# Patient Record
Sex: Female | Born: 1995 | Race: White | Hispanic: No | Marital: Single | Smoking: Never smoker
Health system: Southern US, Community
[De-identification: ages and names within clinical notes are randomized; demographics above are authoritative.]

---

## 2017-02-09 ENCOUNTER — Emergency Department (HOSPITAL_COMMUNITY): Payer: 59

## 2017-02-09 ENCOUNTER — Encounter (HOSPITAL_COMMUNITY): Payer: Self-pay | Admitting: *Deleted

## 2017-02-09 ENCOUNTER — Emergency Department (HOSPITAL_COMMUNITY)
Admission: EM | Admit: 2017-02-09 | Discharge: 2017-02-09 | Disposition: A | Discharge status: Home / Self Care | Payer: 59 | Attending: Emergency Medicine | Admitting: Emergency Medicine

## 2017-02-09 DIAGNOSIS — Z79899 Other long term (current) drug therapy: Secondary | ICD-10-CM | POA: Diagnosis not present

## 2017-02-09 DIAGNOSIS — R0789 Other chest pain: Secondary | ICD-10-CM | POA: Insufficient documentation

## 2017-02-09 DIAGNOSIS — R000 Tachycardia, unspecified: Secondary | ICD-10-CM | POA: Diagnosis not present

## 2017-02-09 DIAGNOSIS — R079 Chest pain, unspecified: Secondary | ICD-10-CM

## 2017-02-09 DIAGNOSIS — R0902 Hypoxemia: Secondary | ICD-10-CM | POA: Diagnosis not present

## 2017-02-09 LAB — MAGNESIUM: MAGNESIUM: 1.5 mg/dL — AB (ref 1.7–2.4)

## 2017-02-09 LAB — BASIC METABOLIC PANEL
Anion gap: 9 (ref 5–15)
BUN: 9 mg/dL (ref 6–20)
CALCIUM: 10.1 mg/dL (ref 8.9–10.3)
CO2: 23 mmol/L (ref 22–32)
CREATININE: 0.83 mg/dL (ref 0.44–1.00)
Chloride: 105 mmol/L (ref 101–111)
GFR calc non Af Amer: >60 mL/min (ref >60)
GLUCOSE: 108 mg/dL — AB (ref 65–99)
Potassium: 3.8 mmol/L (ref 3.5–5.1)
Sodium: 137 mmol/L (ref 135–145)

## 2017-02-09 LAB — CBC
HEMATOCRIT: 42.1 % (ref 36.0–46.0)
Hemoglobin: 14.3 g/dL (ref 12.0–15.0)
MCH: 30.4 pg (ref 26.0–34.0)
MCHC: 34 g/dL (ref 30.0–36.0)
MCV: 89.4 fL (ref 78.0–100.0)
PLATELETS: 325 10*3/uL (ref 150–400)
RBC: 4.71 MIL/uL (ref 3.87–5.11)
RDW: 13.1 % (ref 11.5–15.5)
WBC: 7 10*3/uL (ref 4.0–10.5)

## 2017-02-09 LAB — CK TOTAL AND CKMB (NOT AT ARMC)
CK TOTAL: 70 U/L (ref 38–234)
CK, MB: 0.7 ng/mL (ref 0.5–5.0)
Relative Index: INVALID (ref 0.0–2.5)

## 2017-02-09 LAB — I-STAT BETA HCG BLOOD, ED (MC, WL, AP ONLY): I-stat hCG, quantitative: <5 m[IU]/mL (ref <5)

## 2017-02-09 LAB — TSH: TSH: 1.486 u[IU]/mL (ref 0.350–4.500)

## 2017-02-09 LAB — I-STAT TROPONIN, ED
Troponin i, poc: 0 ng/mL (ref 0.00–0.08)
Troponin i, poc: 0 ng/mL (ref 0.00–0.08)

## 2017-02-09 MED ORDER — METOPROLOL TARTRATE 5 MG/5ML IV SOLN
10.0000 mg | Freq: Once | INTRAVENOUS | Status: AC
  Administered 2017-02-09: 10 mg via INTRAVENOUS
  Filled 2017-02-09: qty 10

## 2017-02-09 MED ORDER — SODIUM CHLORIDE 0.9 % IV BOLUS (SEPSIS)
1000.0000 mL | Freq: Once | INTRAVENOUS | Status: AC
  Administered 2017-02-09: 1000 mL via INTRAVENOUS

## 2017-02-09 MED ORDER — LORAZEPAM 2 MG/ML IJ SOLN
1.0000 mg | Freq: Once | INTRAMUSCULAR | Status: AC
  Administered 2017-02-09: 1 mg via INTRAVENOUS
  Filled 2017-02-09: qty 1

## 2017-02-09 MED ORDER — IOPAMIDOL (ISOVUE-300) INJECTION 61%
INTRAVENOUS | Status: AC
  Filled 2017-02-09: qty 75

## 2017-02-09 MED ORDER — IOPAMIDOL (ISOVUE-370) INJECTION 76%
INTRAVENOUS | Status: AC
  Administered 2017-02-09: 100 mL via INTRAVENOUS
  Filled 2017-02-09: qty 100

## 2017-02-09 NOTE — ED Provider Notes (Signed)
[facility]-EMERGENCY DEPT Provider Note   CSN: [id number] Arrival date & time: 02/09/17  1008     History   Chief Complaint Chief Complaint  Patient presents with  . Chest Pain    HPI Joann Moore is a 21 y.o. female.  Patient presents by ems from urgent care where she was noted to be tachycardic and hypoxic into the high 80s.  She presents after experiencing gradual onset of left sided chest pain.  Her chest pain is currently a 6/10, described as cramping in the front, and radiates down her left arm and into her back where it feels sharp.  Her chest pain started last night started after she ate dinner, she attempted Tums for relief which did not change her pain.  She reports feeling like her heart is "doing something funny" and missing a beat occasionally.  She reports associated shortness of breath, no nausea, vomiting, abdominal pain. .  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  She reports that over the past month she was taking her "old" birth control as she "accidentally grabbed the wrong package."   Patient denies drug use.  Takes spironolactone for acne and birth control. She has recently had a cold, with congestion, productive cough, cough and post nasal drainage.  She reports that her symptoms are improving and she feels she is almost better.        History reviewed. No pertinent past medical history.  There are no active problems to display for this patient.   History reviewed. No pertinent surgical history.  OB History    No data available       Home Medications    Prior to Admission medications   Medication Sig Start Date End Date Taking? Authorizing Provider  acetaminophen (TYLENOL) 325 MG tablet Take 650 mg by mouth every 6 (six) hours as needed for mild pain.   Yes [provider]  ibuprofen (ADVIL,MOTRIN) 200 MG  tablet Take 200 mg by mouth every 6 (six) hours as needed for moderate pain.   Yes [provider]  Multiple Vitamin (MULTIVITAMIN) tablet Take 1 tablet by mouth daily.   Yes [provider]  norethindrone-ethinyl estradiol-iron (MICROGESTIN FE,GILDESS FE,LOESTRIN FE) 1.5-30 MG-MCG tablet Take 1 tablet by mouth daily.   Yes [provider]  RESTASIS MULTIDOSE 0.05 % ophthalmic emulsion Place 1 drop into both eyes 2 (two) times daily. 02/08/17  Yes [provider]    Family History History reviewed. No pertinent family history.  Social History Social History  Substance Use Topics  . Smoking status: Never Smoker  . Smokeless tobacco: Never Used  . Alcohol use Yes     Allergies   Hydrocodone   Review of Systems Review of Systems  HENT: Positive for congestion, postnasal drip and rhinorrhea. Negative for sinus pain and sinus pressure.   Eyes: Negative for visual disturbance.  Respiratory: Positive for chest tightness and shortness of breath.   Cardiovascular: Positive for chest pain and palpitations. Negative for leg swelling.  Gastrointestinal: Negative for abdominal pain, diarrhea, nausea and vomiting.  Genitourinary: Negative for decreased urine volume, flank pain and urgency.  Musculoskeletal: Positive for back pain. Negative for arthralgias, joint swelling and neck stiffness.  Skin: Negative for color change and rash.  Neurological: Negative for weakness, light-headedness and numbness.  Psychiatric/Behavioral: The patient is nervous/anxious.  Physical Exam Updated Vital Signs BP 115/70   Pulse 76   Temp 98.1 F (36.7 C) (Oral)   Resp 20   LMP 02/02/2017 (Approximate)   SpO2 100%   Physical Exam  Constitutional: She appears well-developed and well-nourished.  HENT:  Head: Normocephalic and atraumatic.  Nose: Nose normal.  Mouth/Throat: No oropharyngeal exudate.  Eyes: Conjunctivae are normal.  Neck: Normal range of motion.  Neck supple. No JVD present. No tracheal deviation present.  Cardiovascular: S1 normal, S2 normal, normal heart sounds and intact distal pulses.  An irregular rhythm present. Tachycardia present.  Exam reveals no friction rub.   No murmur heard. Pulmonary/Chest: Effort normal and breath sounds normal. No accessory muscle usage or stridor. No respiratory distress. She has no wheezes.  Abdominal: Soft. Bowel sounds are normal. She exhibits no distension. There is no tenderness.  Musculoskeletal: She exhibits no edema.  Neurological: She is alert. No cranial nerve deficit. She exhibits normal muscle tone.  Skin: Skin is warm, dry and intact. No rash noted. She is not diaphoretic. No cyanosis. Nails show no clubbing.  Psychiatric: Her speech is normal and behavior is normal. Judgment and thought content normal. Her mood appears anxious. Cognition and memory are normal.  Nursing note and vitals reviewed.    ED Treatments / Results  Labs (all labs ordered are listed, but only abnormal results are displayed) Labs Reviewed  BASIC METABOLIC PANEL - Abnormal; Notable for the following:       Result Value   Glucose, Bld 108 (*)    All other components within normal limits  MAGNESIUM - Abnormal; Notable for the following:    Magnesium 1.5 (*)    All other components within normal limits  CBC  TSH  CK TOTAL AND CKMB (NOT AT [facility])  I-STAT BETA HCG BLOOD, ED ([facility], [facility], [facility] ONLY)  I-STAT TROPOININ, ED  I-STAT TROPOININ, ED    EKG  EKG Interpretation  Date/Time:  Thursday Feb 09 2017 10:09:30 EDT Ventricular Rate:  117 PR Interval:    QRS Duration: 83 QT Interval:  318 QTC Calculation: 444 R Axis:   75 Text Interpretation:  Sinus tachycardia No previous ECGs available Confirmed by YAO  MD, DAVID ([id number]) on 02/09/2017 10:21:10 AM       Radiology Dg Chest 2 View  Result Date: 02/09/2017 CLINICAL DATA:  Chest and lt arm pain started 930 last night till now,,pain goes to upper lt shoulder  area as well EXAM: CHEST - 2 VIEW COMPARISON:  none FINDINGS: Lungs are clear. Heart size and mediastinal contours are within normal limits. No effusion.  No pneumothorax. Visualized bones unremarkable. IMPRESSION: No acute cardiopulmonary disease. Electronically Signed   By: Corlis Leak M.D.   On: 02/09/2017 10:51   Ct Angio Chest Pe W Or Wo Contrast  Result Date: 02/09/2017 CLINICAL DATA:  Sternal chest pain radiating into back last night. EXAM: CT ANGIOGRAPHY CHEST WITH CONTRAST TECHNIQUE: Multidetector CT imaging of the chest was performed using the standard protocol during bolus administration of intravenous contrast. Multiplanar CT image reconstructions and MIPs were obtained to evaluate the vascular anatomy. CONTRAST:  100 cc Isovue 370 IV COMPARISON:  Chest x-ray earlier today FINDINGS: Cardiovascular: No filling defects in the pulmonary arteries to suggest pulmonary emboli. Insert Heart is Mediastinum/Nodes: No mediastinal, hilar, or axillary adenopathy. Lungs/Pleura: Lungs are clear. No focal airspace opacities or suspicious nodules. No effusions. Upper Abdomen: Imaging into the upper abdomen shows no acute findings. Musculoskeletal: No acute bony abnormality. Review  of the MIP images confirms the above findings. IMPRESSION: No evidence of pulmonary embolus. No acute cardiopulmonary disease. Electronically Signed   By: Charlett Nose M.D.   On: 02/09/2017 12:33    Procedures Procedures (including critical care time)  Medications Ordered in ED Medications  sodium chloride 0.9 % bolus 1,000 mL (0 mLs Intravenous Stopped 02/09/17 1349)  iopamidol (ISOVUE-370) 76 % injection (100 mLs Intravenous Contrast Given 02/09/17 1153)  LORazepam (ATIVAN) injection 1 mg (1 mg Intravenous Given 02/09/17 1138)  sodium chloride 0.9 % bolus 1,000 mL (0 mLs Intravenous Stopped 02/09/17 1537)  LORazepam (ATIVAN) injection 1 mg (1 mg Intravenous Given 02/09/17 1323)  metoprolol (LOPRESSOR) injection 10 mg (10 mg  Intravenous Given 02/09/17 1323)     Initial Impression / Assessment and Plan / ED Course  I have reviewed the triage vital signs and the nursing notes.  Pertinent labs & imaging results that were available during my care of the patient were reviewed by me and considered in my medical decision making (see chart for details).  Clinical Course as of Feb 10 1644  Thu Feb 09, 2017  1047 Informed by RN that patient is down to 88 on room air after coming back from x-ray.   [EH]  1100 Informed patient of results thus far and plan for CT chest.  No needs at this time.   [EH]  1212 Checked in on patient.  She is tearful but reports feeling less anxious.  HR in 130s.  Awaiting CTA read  [EH]  1258 Informed patient of CT results.  Patient reports recent stressors in her life including moving to [place], starting a new job on Monday, moving in with her boy friend and graduating college last weekend.   [EH]  1349 Re-checked patient, appears comfortable, napping in the dark.  States pain is down to 4/10. No shortness of breath.   [EH]    Clinical Course User Index [EH] Cristina Gong, PA-C     Due to the patients acuity, tachycardia and hypoxia Dr. Silverio Lay was involved early in the patients care.  Donnamarie Poag presented from the urgent care with left sided chest pain that radiates into her arm. She received an extensive work up due to tachycardia and concerns of being hypoxic.  She is not pregnant, PE was considered due to hormonal birth control usage, hypoxia, and tachycardia, however a CTA was negative for PE.  Chest x-ray was normal.  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  Her initial and repeat troponin were both normal.   She improved in the ED and her heart rate decreased to normal after ativan, fluids, and lopressor IV.  Her heart rate remained normal and she ambulated in the hall with out hypoxia.    Patient is to be  discharged with recommendation to follow up with PCP in regards to today's hospital visit.  CTA negative for PE, VSS at time of discharge, no tracheal deviation, no JVD or new murmur, RRR at discharge, breath sounds equal bilaterally, EKG without acute abnormalities, negative troponin x2, and negative CXR. Pt has been advised to return to the ED if CP becomes exertional, associated with diaphoresis or nausea, radiates to left jaw/arm, worsens or becomes concerning in any way. Pt appears reliable for follow up and is agreeable to discharge.   Case has been discussed with and seen by Dr. Silverio Lay who agrees with the above plan to discharge.  Final Clinical Impressions(s) / ED Diagnoses   Final diagnoses:  Tachycardia  Hypoxia  Chest pain, unspecified type    New Prescriptions New Prescriptions   No medications on file     Norman ClayHammond, Tedrick [place], PA-C 02/09/17 1655    Charlynne PanderYao, David Hsienta, MD 02/11/17 1046

## 2017-02-09 NOTE — ED Notes (Signed)
Pt ambulates to RR with no assistance. Pt O2 sat 96% on RA upon returning and HR 140. Pt is SOB and states she is in more pain.

## 2017-02-09 NOTE — ED Notes (Signed)
Pt is in stable condition upon d/c and ambulates from ED. 

## 2017-02-09 NOTE — ED Notes (Signed)
Pt ambulated throughout hallway with standby assist; denied weakness, dizziness, lightheadedness.   Highest O2 sat: 100% Lowest O2 sat: 92%  HR range: 112-120bpm

## 2017-02-09 NOTE — ED Triage Notes (Signed)
Pt arrives from [facility]. Pt states she was at dinner last night when she had a sudden intense pain to the left chest with radiation to the back and into the left arm. Pt is tachycardic and hypertensive upon arrival and also was hypoxic upon EMS arrival, now 100% on 4L [place].

## 2017-02-09 NOTE — Discharge Instructions (Signed)
Today you received a work up for chest pain and high heart rate (tachycardia).  Your heart enzymes were normal, as were your electrolytes, and kidney function.  You are not pregnant.  You had a chest x-ray which was unremarkable and a CTA chest to check for a pulmonary embolism (blood clot in your lung) which was a normal scan.    Please hydrate, eat a healthy diet and use caution and go slow when going from sitting to standing.  Please follow up with a primary care provider and do not hesitate to seek additional medical care if you have any concerns or your symptoms do not improve.

## 2017-02-09 NOTE — ED Notes (Signed)
Got patient undress on the monitor did ekg shown to Dr Yao patient is resting with family at bedside 

## 2018-11-30 IMAGING — CT CT ANGIO CHEST
3 of 7 series · 18 of 36 positions shown · IV contrast (isovue)
Comparison: Chest x-ray earlier today

CLINICAL DATA: Sternal chest pain radiating into back last night.

EXAM:
CT ANGIOGRAPHY CHEST WITH CONTRAST
TECHNIQUE: Multidetector CT imaging of the chest was performed using the
standard protocol during bolus administration of intravenous
contrast. Multiplanar CT image reconstructions and MIPs were
obtained to evaluate the vascular anatomy.
CONTRAST:  100 cc Isovue 370 IV

[Series 7: pe thins · axial · 0.65mm/px · z∈[+1095,+1315]mm · 12 of 261 slices shown]
[im 21/261  lung]
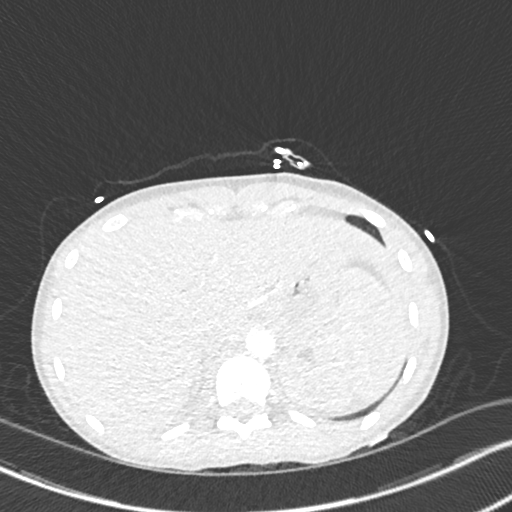
[im 41/261  mediastinal]
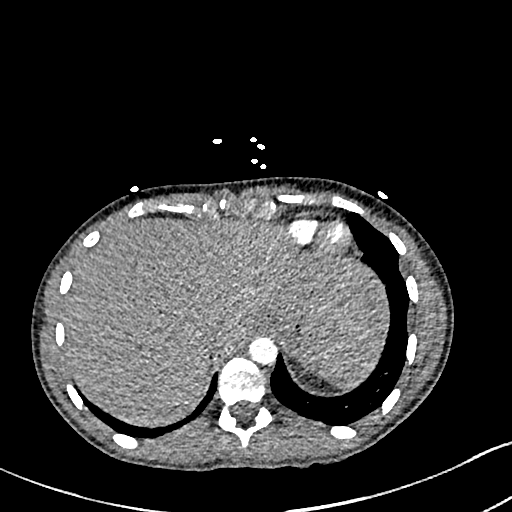
[im 61/261  lung]
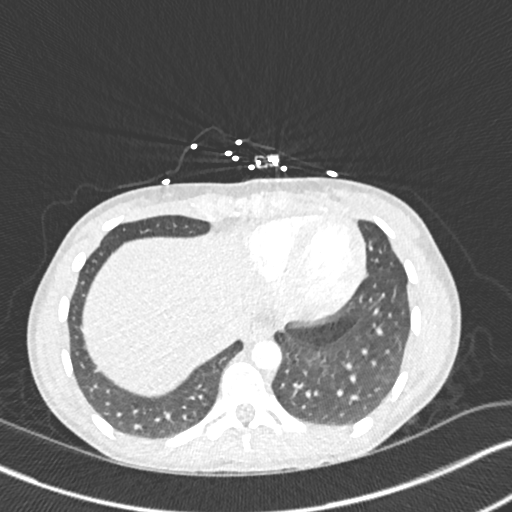
[im 81/261  mediastinal]
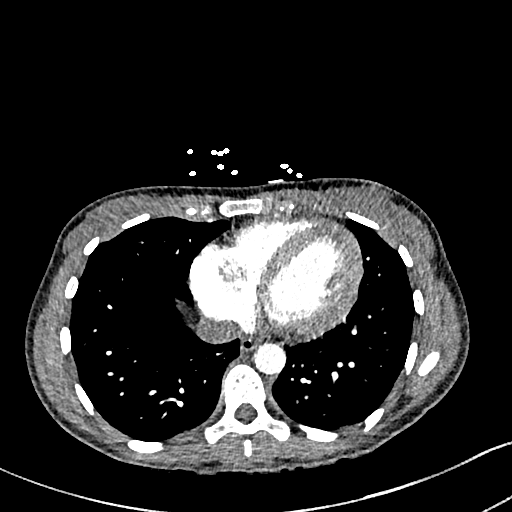
[im 101/261  lung]
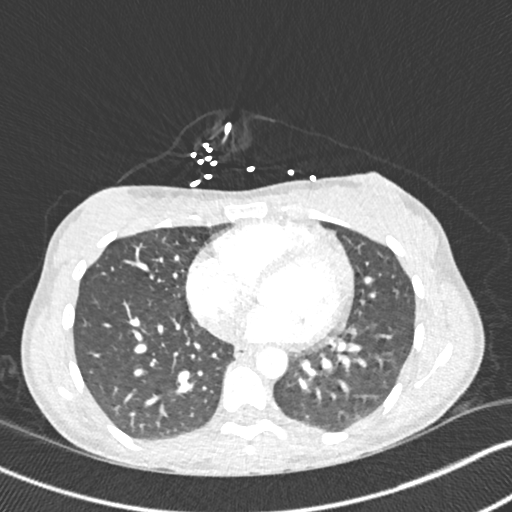
[im 121/261  mediastinal]
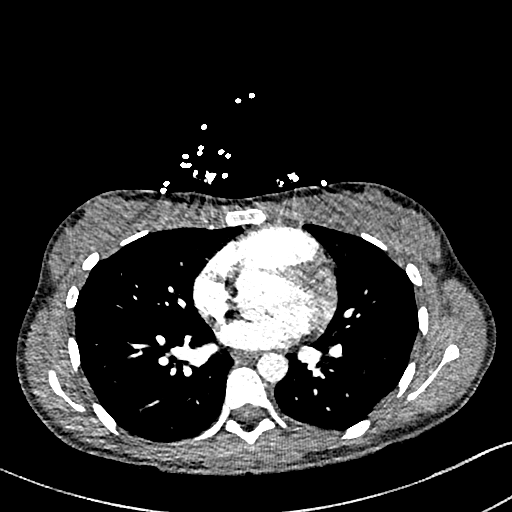
[im 141/261  lung]
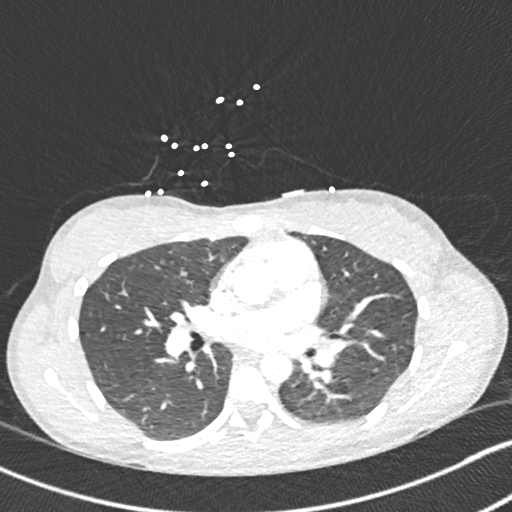
[im 161/261  mediastinal]
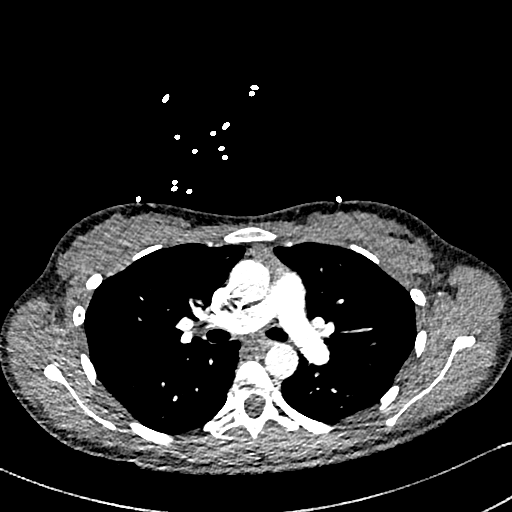
[im 181/261  lung]
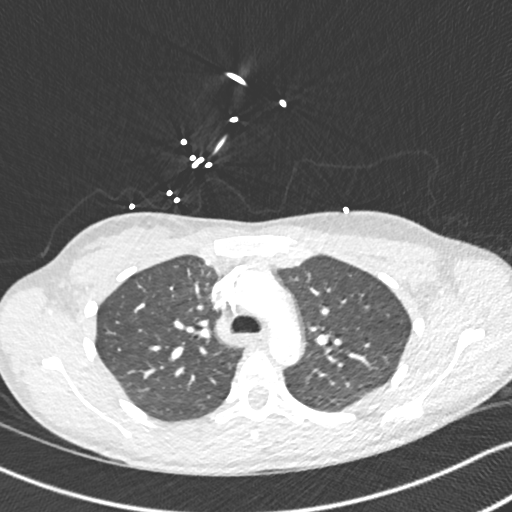
[im 201/261  mediastinal]
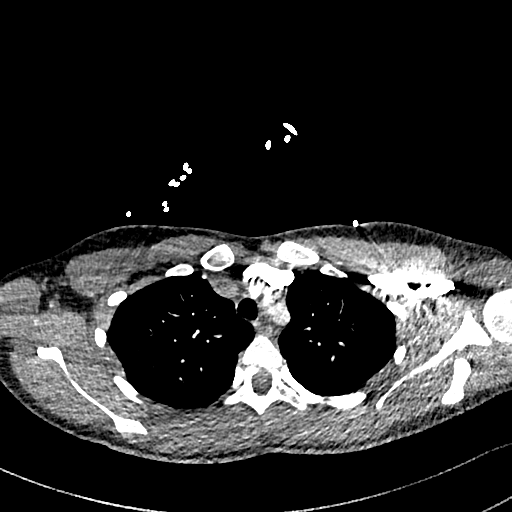
[im 221/261  lung]
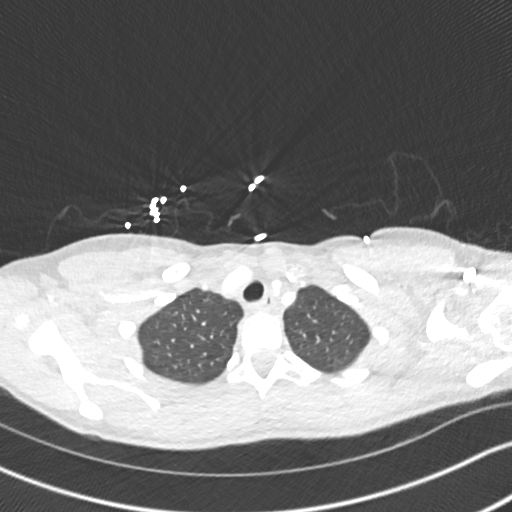
[im 241/261  mediastinal]
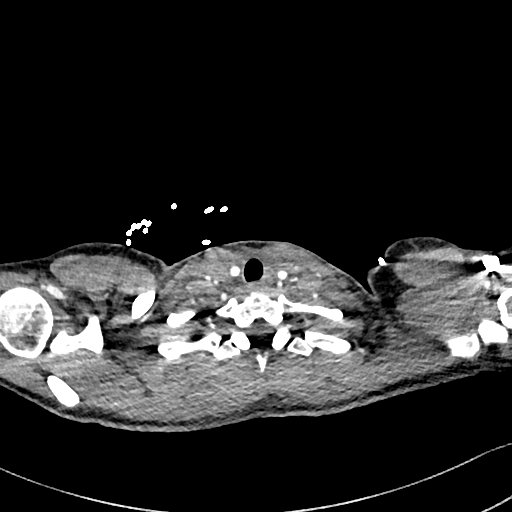

[Series 8: pe lung · axial · 0.61mm/px · z∈[+1123,+1291]mm · 5 of 128 slices shown]
[im 22/128  mediastinal]
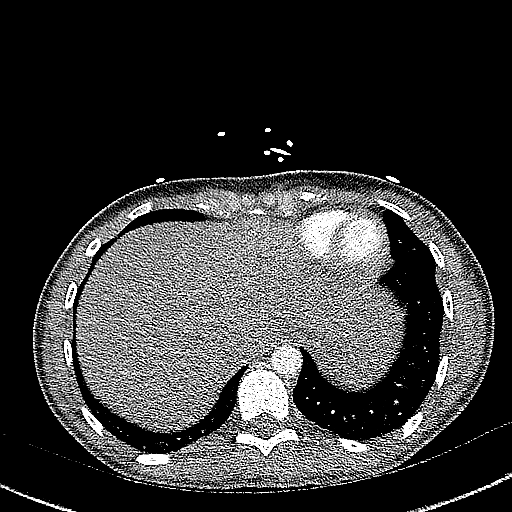
[im 43/128  mediastinal]
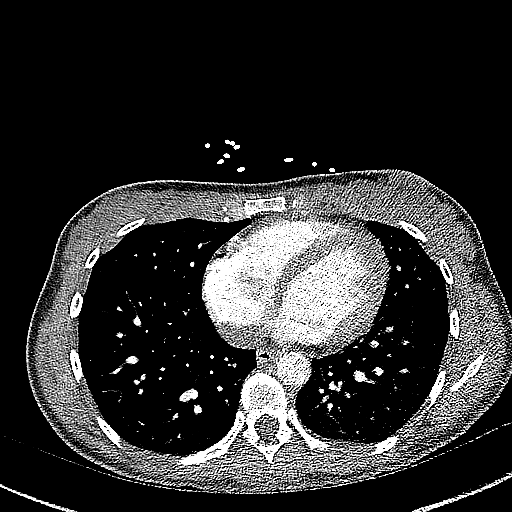
[im 64/128  mediastinal]
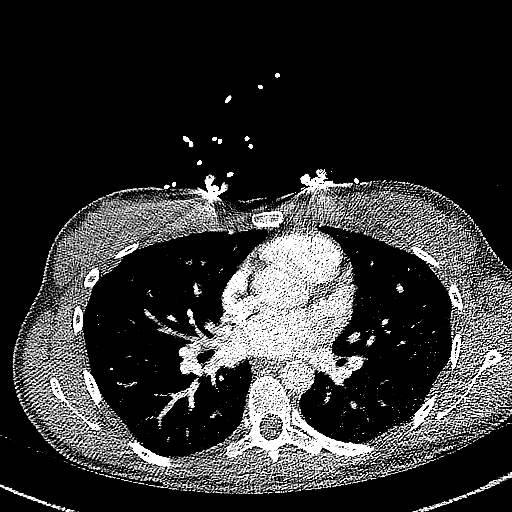
[im 85/128  mediastinal]
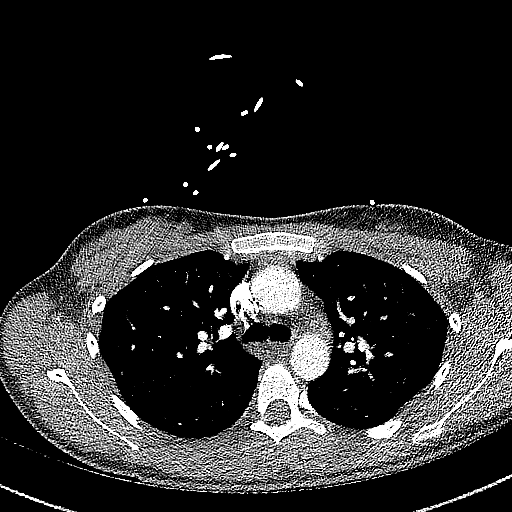
[im 106/128  mediastinal]
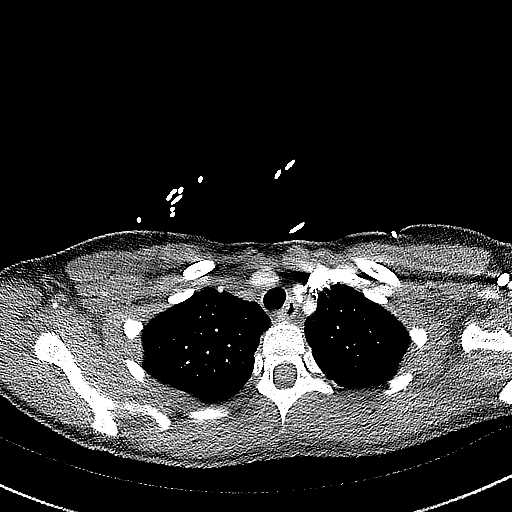

[Series 9: pe 2mm cor · coronal · 0.52mm/px · 1 of 106 slices shown]
[im 53/106  mediastinal]
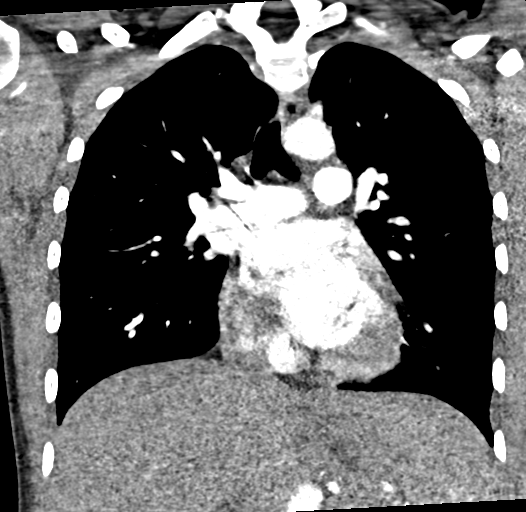

[18 of 36 positions shown; findings below may reference images not displayed]

FINDINGS: Cardiovascular: No filling defects in the pulmonary arteries to
suggest pulmonary emboli. Insert Heart is

Mediastinum/Nodes: No mediastinal, hilar, or axillary adenopathy.

Lungs/Pleura: Lungs are clear. No focal airspace opacities or
suspicious nodules. No effusions.

Upper Abdomen: Imaging into the upper abdomen shows no acute
findings.

Musculoskeletal: No acute bony abnormality.

Review of the MIP images confirms the above findings.
IMPRESSION: No evidence of pulmonary embolus.

No acute cardiopulmonary disease.
